# Patient Record
Sex: Male | Born: 1990 | Race: Black or African American | Hispanic: No | Smoking: Light tobacco smoker
Health system: Southern US, Community
[De-identification: ages and names within clinical notes are randomized; demographics above are authoritative.]

## PROBLEM LIST (undated history)

## (undated) DIAGNOSIS — R569 Unspecified convulsions: Secondary | ICD-10-CM

## (undated) DIAGNOSIS — J45909 Unspecified asthma, uncomplicated: Secondary | ICD-10-CM

## (undated) HISTORY — PX: STOMACH SURGERY: SHX791

---

## 2019-12-30 DIAGNOSIS — E875 Hyperkalemia: Secondary | ICD-10-CM | POA: Diagnosis present

## 2019-12-30 DIAGNOSIS — J9600 Acute respiratory failure, unspecified whether with hypoxia or hypercapnia: Secondary | ICD-10-CM | POA: Diagnosis present

## 2019-12-30 DIAGNOSIS — R200 Anesthesia of skin: Secondary | ICD-10-CM | POA: Diagnosis present

## 2019-12-30 DIAGNOSIS — R402312 Coma scale, best motor response, none, at arrival to emergency department: Secondary | ICD-10-CM | POA: Diagnosis present

## 2019-12-30 DIAGNOSIS — T17918A Gastric contents in respiratory tract, part unspecified causing other injury, initial encounter: Secondary | ICD-10-CM | POA: Diagnosis present

## 2019-12-30 DIAGNOSIS — N390 Urinary tract infection, site not specified: Secondary | ICD-10-CM | POA: Diagnosis present

## 2019-12-30 DIAGNOSIS — Z765 Malingerer [conscious simulation]: Secondary | ICD-10-CM

## 2019-12-30 DIAGNOSIS — T17908A Unspecified foreign body in respiratory tract, part unspecified causing other injury, initial encounter: Secondary | ICD-10-CM | POA: Diagnosis present

## 2019-12-30 DIAGNOSIS — Y900 Blood alcohol level of less than 20 mg/100 ml: Secondary | ICD-10-CM | POA: Diagnosis present

## 2019-12-30 DIAGNOSIS — R402212 Coma scale, best verbal response, none, at arrival to emergency department: Secondary | ICD-10-CM | POA: Diagnosis present

## 2019-12-30 DIAGNOSIS — Z20822 Contact with and (suspected) exposure to covid-19: Secondary | ICD-10-CM | POA: Diagnosis present

## 2019-12-30 DIAGNOSIS — T407X1A Poisoning by cannabis (derivatives), accidental (unintentional), initial encounter: Principal | ICD-10-CM | POA: Diagnosis present

## 2019-12-30 DIAGNOSIS — A749 Chlamydial infection, unspecified: Secondary | ICD-10-CM | POA: Diagnosis present

## 2019-12-30 DIAGNOSIS — R402112 Coma scale, eyes open, never, at arrival to emergency department: Secondary | ICD-10-CM | POA: Diagnosis present

## 2019-12-30 DIAGNOSIS — R401 Stupor: Secondary | ICD-10-CM

## 2019-12-30 DIAGNOSIS — G9341 Metabolic encephalopathy: Secondary | ICD-10-CM | POA: Diagnosis present

## 2019-12-30 DIAGNOSIS — A549 Gonococcal infection, unspecified: Secondary | ICD-10-CM | POA: Diagnosis present

## 2019-12-30 NOTE — ED Provider Notes (Signed)
[facility] Emergency Department Provider Note  ____________________________________________   First MD Initiated Contact with Patient 12/30/19 2343     (approximate)  I have reviewed the triage vital signs and the nursing notes.   HISTORY  Chief Complaint Drug Overdose and Altered Mental Status  Level 5 caveat:  history/ROS limited by acute/critical illness  HPI Calvin Woodward is a 29 y.o. male with unknown past medical and social history who presents by EMS for unresponsiveness.  History was limited.  Apparently the patient was found in a ditch near his home.  There was some question of an unknown substance but no one could tell me anything about the history except that his wife spoke briefly to paramedics.  He was minimally responsive to anything including painful stimuli for paramedics and received a total of 8 mg of Narcan in route to the hospital via IV, intramuscular, and intranasal methods, with no response.  He intermittently would have episodes of apnea and then would begin breathing but with a wheezing and labored quality.  Upon arrival to the ED he was unresponsive to verbal and painful stimuli.  See hospital course for additional details.         History reviewed. No pertinent past medical history.  Patient Active Problem List   Diagnosis Date Noted  . Acute metabolic encephalopathy 12/31/2019    History reviewed. No pertinent surgical history.  Prior to Admission medications   Not on File    Allergies Patient has no known allergies.  History reviewed. No pertinent family history.  Social History Social History   Tobacco Use  . Smoking status: Unknown If Ever Smoked  Substance Use Topics  . Alcohol use: Not on file  . Drug use: Not on file    Review of Systems Level 5 caveat:  history/ROS limited by acute/critical illness ____________________________________________   PHYSICAL EXAM:  ED Triage Vitals  Enc Vitals  Group     BP 12/30/19 2325 124/81     Pulse Rate 12/30/19 2330 95     Resp 12/30/19 2335 (!) 22     Temp 12/31/19 0147 (!) 97.2 F (36.2 C)     Temp Source 12/31/19 0147 Axillary     SpO2 12/30/19 2330 100 %     Weight 12/30/19 2325 73.5 kg (162 lb 0.6 oz)     Height --      Head Circumference --      Peak Flow --      Pain Score 12/30/19 2325 0     Pain Loc --      Pain Edu? --      Excl. in GC? --      Constitutional: The patient is breathing on his own but in a labored fashion.  He is unresponsive to stimuli.  Home monitoring/law enforcement ankle bracelet is present on his right ankle. Eyes: Conjunctivae are normal.  His eyes occasionally flutter but do not open spontaneously.  No gaze deviation.  No corneal reflex.  Pupils are sluggish but equal and small but not miotic. Head: Atraumatic. Nose: No congestion/rhinnorhea. Mouth/Throat: Airway is generally unremarkable upon intubation with a moderate amount of saliva and clear secretions but no blood and no evidence of infection in the airway. Neck: No stridor.  No meningeal signs.  No expanding neck hematomas. Cardiovascular: Mild tachycardia, regular rhythm. Good peripheral circulation. Grossly normal heart sounds. Respiratory: Coarse breath sounds and wheezing, slow but spontaneous respirations.  Occasional episodes of apnea that goes on  for a few seconds before he once again resumes spontaneous breathing. Gastrointestinal: Soft and nontender. No distention.  Musculoskeletal: Muscular and thin/healthy body habitus.  No gross deformities appreciated. Neurologic: GCS of 3 with no response to suction with a Yankauer, no corneal reflex, no response to painful stimuli.  He grimaced upon inhaling an ammonia capsule and started to gag briefly but had no other response. Skin:  Skin is warm, dry and intact.  Extensive tattoos, no evidence of acute infection or injury on his skin.   ____________________________________________    LABS (all labs ordered are listed, but only abnormal results are displayed)  Labs Reviewed  CHLAMYDIA/NGC RT PCR ([facility] ONLY) - Abnormal; Notable for the following components:      Result Value   Chlamydia Tr DETECTED (*)    N gonorrhoeae DETECTED (*)    All other components within normal limits  LACTIC ACID, PLASMA - Abnormal; Notable for the following components:   Lactic Acid, Venous 2.5 (*)    All other components within normal limits  CBC WITH DIFFERENTIAL/PLATELET - Abnormal; Notable for the following components:   WBC 10.7 (*)    All other components within normal limits  COMPREHENSIVE METABOLIC PANEL - Abnormal; Notable for the following components:   Potassium 5.3 (*)    Glucose, Bld 100 (*)    All other components within normal limits  BLOOD GAS, ARTERIAL - Abnormal; Notable for the following components:   pH, Arterial 7.50 (*)    pO2, Arterial 181 (*)    Acid-Base Excess 2.4 (*)    All other components within normal limits  URINALYSIS, ROUTINE W REFLEX MICROSCOPIC - Abnormal; Notable for the following components:   Color, Urine YELLOW (*)    APPearance HAZY (*)    Specific Gravity, Urine 1.033 (*)    Protein, ur 30 (*)    Leukocytes,Ua MODERATE (*)    WBC, UA >50 (*)    All other components within normal limits  URINE DRUG SCREEN, QUALITATIVE ([facility] ONLY) - Abnormal; Notable for the following components:   Cannabinoid 50 Ng, Ur [place] POSITIVE (*)    All other components within normal limits  RESPIRATORY PANEL BY RT PCR (FLU A&B, COVID)  CULTURE, RESPIRATORY  CULTURE, BLOOD (ROUTINE X 2)  CULTURE, BLOOD (ROUTINE X 2)  MRSA PCR SCREENING  ETHANOL  MAGNESIUM  LIPASE, BLOOD  RAPID HIV SCREEN (HIV 1/2 AB+AG)  LACTIC ACID, PLASMA  HEPATITIS C ANTIBODY  HEPATITIS B SURFACE ANTIBODY, QUANTITATIVE  PROCALCITONIN  CBC  COMPREHENSIVE METABOLIC PANEL  AMMONIA  STREP PNEUMONIAE URINARY ANTIGEN  LEGIONELLA PNEUMOPHILA SEROGP 1 UR AG  SALICYLATE LEVEL   ACETAMINOPHEN LEVEL  BLOOD GAS, ARTERIAL   ____________________________________________  EKG  ED ECG REPORT I, Hinda Kehr, the attending physician, personally viewed and interpreted this ECG.  Date: 12/30/2019 EKG Time: 23:35 Rate: 81 Rhythm: normal sinus rhythm QRS Axis: normal Intervals: normal ST/T Wave abnormalities: early repolarization Narrative Interpretation: no definitive evidence of acute ischemia; does not meet STEMI criteria.   ____________________________________________  RADIOLOGY I, Hinda Kehr, personally viewed and evaluated these images (plain radiographs) as part of my medical decision making, as well as reviewing the written report by the radiologist.  ED MD interpretation:  No acute abnormalities on CT head nor C-spine.  Appropriate tube placement on CXR.  Official radiology report(s): DG Abdomen 1 View  Result Date: 12/31/2019 CLINICAL DATA:  OG tube placement EXAM: ABDOMEN - 1 VIEW COMPARISON:  None. FINDINGS: The OG tube extends below the left  hemidiaphragm. The tip terminates over the expected region of the gastric body. The visualized upper abdomen is unremarkable otherwise. IMPRESSION: OG tube projects over the gastric body. Electronically Signed   By: Katherine Mantle M.D.   On: 12/31/2019 00:26   CT Head Wo Contrast  Result Date: 12/31/2019 CLINICAL DATA:  Altered mental status. EXAM: CT HEAD WITHOUT CONTRAST CT CERVICAL SPINE WITHOUT CONTRAST TECHNIQUE: Multidetector CT imaging of the head and cervical spine was performed following the standard protocol without intravenous contrast. Multiplanar CT image reconstructions of the cervical spine were also generated. COMPARISON:  None. FINDINGS: CT HEAD FINDINGS Brain: No evidence of acute infarction, hemorrhage, hydrocephalus, extra-axial collection or mass lesion/mass effect. Vascular: No hyperdense vessel or unexpected calcification. Skull: Normal. Negative for fracture or focal lesion.  Sinuses/Orbits: No acute finding. Other: None. CT CERVICAL SPINE FINDINGS Alignment: Normal. Skull base and vertebrae: No acute fracture. No primary bone lesion or focal pathologic process. Soft tissues and spinal canal: No prevertebral fluid or swelling. No visible canal hematoma. Disc levels: The disc heights are relatively well preserved throughout the cervical spine. Upper chest: Negative. Other: None IMPRESSION: 1. Normal CT evaluation of the head. 2. No evidence of acute traumatic injury to the cervical spine. Electronically Signed   By: Katherine Mantle M.D.   On: 12/31/2019 01:49   DG Chest Portable 1 View  Result Date: 12/31/2019 CLINICAL DATA:  OG tube placement. Endotracheal tube placement. Overdose. EXAM: PORTABLE CHEST 1 VIEW COMPARISON:  None. FINDINGS: The endotracheal tube terminates approximately 5.8 cm above the carina. The enteric tube appears to extend below the left hemidiaphragm. The tip is not visualized on this study. The lungs are clear. There is no pneumothorax or focal infiltrate. No significant pleural effusion. The heart size is normal. IMPRESSION: 1. Satisfactory position of the endotracheal tube. 2. Enteric tube appears to extend below the left hemidiaphragm. 3. No acute cardiopulmonary process. Electronically Signed   By: Katherine Mantle M.D.   On: 12/31/2019 00:25    ____________________________________________   PROCEDURES   Procedure(s) performed (including Critical Care):  .Critical Care Performed by: Loleta Rose, MD Authorized by: Loleta Rose, MD   Critical care provider statement:    Critical care time (minutes):  45   Critical care time was exclusive of:  Separately billable procedures and treating other patients   Critical care was necessary to treat or prevent imminent or life-threatening deterioration of the following conditions:  Toxidrome and respiratory failure   Critical care was time spent personally by me on the following activities:   Development of treatment plan with patient or surrogate, discussions with consultants, evaluation of patient's response to treatment, examination of patient, obtaining history from patient or surrogate, ordering and performing treatments and interventions, ordering and review of laboratory studies, ordering and review of radiographic studies, pulse oximetry, re-evaluation of patient's condition and review of old charts .1-3 Lead EKG Interpretation Performed by: Loleta Rose, MD Authorized by: Loleta Rose, MD     Interpretation: abnormal     ECG rate:  102   ECG rate assessment: tachycardic     Rhythm: sinus rhythm     Ectopy: none     Conduction: normal   Procedure Name: Intubation Date/Time: 12/30/2019 11:30 PM Performed by: Loleta Rose, MD Pre-anesthesia Checklist: Patient identified, Emergency Drugs available, Suction available and Patient being monitored Oxygen Delivery Method: Nasal cannula Preoxygenation: Pre-oxygenation with 100% oxygen Induction Type: IV induction and Rapid sequence Laryngoscope Size: Glidescope and 3 Tube size: 7.5 mm Number  of attempts: 1 Placement Confirmation: ETT inserted through vocal cords under direct vision,  CO2 detector and Breath sounds checked- equal and bilateral Secured at: 24 cm Tube secured with: ETT holder Dental Injury: Teeth and Oropharynx as per pre-operative assessment         ____________________________________________   INITIAL IMPRESSION / MDM / ASSESSMENT AND PLAN / ED COURSE  As part of my medical decision making, I reviewed the following data within the electronic MEDICAL RECORD NUMBER Nursing notes reviewed and incorporated, Labs reviewed , EKG interpreted , Old chart reviewed, Radiograph reviewed , Discussed with admitting provider (ICU NP Elvina Sidle) and reviewed Notes from prior ED visits   Differential diagnosis includes, but is not limited to, substance abuse/overdose, acute intracranial injury (hemorrhage or less likely  ischemic CVA), cervical spine injury, aspiration.  The patient had no response to Narcan both prior to arrival and to Narcan 1 mg IV upon arrival to the emergency department.  I observed him for a couple of minutes but he has virtually no response to painful stimuli and he is not protecting his airway with frequent episodes of apnea and intolerance of his secretions, occasional vomiting clear fluids or saliva, etc.  For his safety I performed RSI to secure his airway.  Etomidate 20 mg IV and rocuronium 100 mg IV were used for induction and paralytic.  I asked respiratory therapy to start his PEEP at 10 given the possibility of aspiration.  Once he is settled and on a propofol infusion starting at 20 mcg/kg/min supplemented with fentanyl 100 mcg/h, he will get a head CT and cervical spine CT since I cannot rule out acute intracranial hemorrhage or trauma.  Anticipate admission to the ICU. Labs pending including ABG.      Clinical Course as of Dec 31 222  Fri Dec 31, 2019  0014 RT is adjusting rate down to 14 and PEEP down to 5.  Blood gas, arterial(!) [CF]  0015 WBC's in urine.  Will send GC/chlamydia given that this would be a more likely diagnosis than a complicated UTI in a young male patient  Urinalysis, Routine w reflex microscopic(!) [CF]  0016 Alcohol, Ethyl (B): <10 [CF]  0035 Cannabinoid 50 Ng, Ur [place](!): POSITIVE [CF]  0035 Ordering IV fluids (NS 1L IV bolus), but no other treatment indicated at this time.  Potassium(!): 5.3 [CF]  0037 Appropriate tube placement on CXR and abd films, no acute abnormalities identified   [CF]  0122 I spoke by phone with Jeri Modena (ICU NP) who will admit the patient.  We discussed the case in detail including the pyuria and pending tests.   [CF]  0125 Patient starting to block the vent a little bit with some right arm movement.  I authorized 2 separate IV boluses of propofol 50 mg and the infusion has been increased to 30 mcg/kg/min.  He is also now getting  fentanyl 100 mcg/h.   [CF]  0202 Delayed documentation: I woman who identified herself as Rosiland Oz and the patient's wife (the same person who called and spoke with paramedics) came to the ED and I updated her about the patient's status.  She was brought back to the patient's room.  After that point somehow it was identified that she was not in fact legally married to the patient and she was asked to leave.  ED charge nurse Erie Noe has been in contact with one of the patient's sisters who is out of town but she is apparently the closest legal family  member.  The patient is stable for transfer to the ICU.   [CF]  0203 No acute abnormalities identified on head CT   [CF]    Clinical Course User Index [CF] Loleta Rose, MD     ____________________________________________  FINAL CLINICAL IMPRESSION(S) / ED DIAGNOSES  Final diagnoses:  Acute respiratory failure, unspecified whether with hypoxia or hypercapnia (HCC)  Obtundation     MEDICATIONS GIVEN DURING THIS VISIT:  Medications  fentaNYL in NS (23mcg/ml) infusion-PREMIX (50 mcg/hr Intravenous Bolus 12/31/19 0216)  docusate sodium (COLACE) capsule 100 mg (has no administration in time range)  polyethylene glycol (MIRALAX / GLYCOLAX) packet 17 g (has no administration in time range)  enoxaparin (LOVENOX) injection 40 mg (has no administration in time range)  famotidine (PEPCID) IVPB 20 mg premix (has no administration in time range)  0.9 %  sodium chloride infusion (has no administration in time range)  acetaminophen (TYLENOL) tablet 650 mg (has no administration in time range)  ondansetron (ZOFRAN) injection 4 mg (has no administration in time range)  ipratropium-albuterol (DUONEB) 0.5-2.5 (3) MG/3ML nebulizer solution 3 mL (has no administration in time range)  Chlorhexidine Gluconate Cloth 2 % PADS 6 each (has no administration in time range)  docusate (COLACE) 50 MG/5ML liquid 100 mg (has no administration in time  range)  polyethylene glycol (MIRALAX / GLYCOLAX) packet 17 g (has no administration in time range)  fentaNYL (SUBLIMAZE) bolus via infusion 50 mcg (has no administration in time range)  midazolam (VERSED) injection 2 mg (has no administration in time range)  midazolam (VERSED) injection 2 mg (has no administration in time range)  etomidate (AMIDATE) injection (20 mg Intravenous Given 12/30/19 2336)  rocuronium (ZEMURON) injection (100 mg Intravenous Given 12/30/19 2336)  propofol (DIPRIVAN) 1000 MG/100ML infusion (10 mg Intravenous New Bag/Given 12/31/19 0210)  naloxone (NARCAN) injection 1 mg (1 mg Intravenous Given 12/30/19 2332)  sodium chloride 0.9 % bolus 1,000 mL (0 mLs Intravenous Stopped 12/31/19 0201)  midazolam (VERSED) injection 4 mg (4 mg Intravenous Given 12/31/19 0216)     ED Discharge Orders    None      *Please note:  Calvin Woodward was evaluated in Emergency Department on 12/31/2019 for the symptoms described in the history of present illness. He was evaluated in the context of the global COVID-19 pandemic, which necessitated consideration that the patient might be at risk for infection with the SARS-CoV-2 virus that causes COVID-19. Institutional protocols and algorithms that pertain to the evaluation of patients at risk for COVID-19 are in a state of rapid change based on information released by regulatory bodies including the CDC and federal and state organizations. These policies and algorithms were followed during the patient's care in the ED.  Some ED evaluations and interventions may be delayed as a result of limited staffing during the pandemic.*  Note:  This document was prepared using Dragon voice recognition software and may include unintentional dictation errors.   Loleta Rose, MD 12/31/19 [phone]

## 2019-12-31 DIAGNOSIS — N390 Urinary tract infection, site not specified: Secondary | ICD-10-CM

## 2019-12-31 DIAGNOSIS — A549 Gonococcal infection, unspecified: Secondary | ICD-10-CM | POA: Diagnosis present

## 2019-12-31 DIAGNOSIS — J9601 Acute respiratory failure with hypoxia: Secondary | ICD-10-CM

## 2019-12-31 DIAGNOSIS — A749 Chlamydial infection, unspecified: Secondary | ICD-10-CM | POA: Diagnosis present

## 2019-12-31 DIAGNOSIS — T17908A Unspecified foreign body in respiratory tract, part unspecified causing other injury, initial encounter: Secondary | ICD-10-CM | POA: Diagnosis present

## 2019-12-31 DIAGNOSIS — R200 Anesthesia of skin: Secondary | ICD-10-CM

## 2019-12-31 DIAGNOSIS — G9341 Metabolic encephalopathy: Secondary | ICD-10-CM

## 2019-12-31 NOTE — Progress Notes (Signed)
  PROGRESS NOTE    Calvin Woodward   ZOX:[id number]  DOB: 01-03-1991  PCP: Patient, No Pcp Per    DOA: 12/30/2019 LOS: 0   Brief Narrative   29 y.o. male without known medical history admitted to ICU overnight after being found unresponsive and required intubation for airway protection. This morning, patient was successfully extubated and is stable for TRH to assume care tomorrow.   On admission, patient's UDS was positive for marijuana, alcohol level was less than 10.  Suspected etiology is drug overdose, however further work-up needed to rule out stroke or other cause of patient's obtundation. Patient also now reporting bilateral foot numbness.  Patient has ankle bracelet in place and is on house arrest. MRI lumbar spine has been recommended because patient reports he is unable to feel his feet, however patient is reluctant to remove the ankle bracelet. Per Dr. Belia Woodward, [facility] PD is on board and will be letting patient know if it is okay to remove this for MRI.  Assessment & Plan   Active Problems:   Acute metabolic encephalopathy   Numbness of foot   Gonorrhea in male   Chlamydia   Acute lower UTI   Aspiration into airway  I have reviewed the H&P and progress notes by PCCM and agree with the assessment and plan as outlined.  In addition: --order lumbar MRI if/when patient given okay to remove ankle bracelet --consider neurology consult  --continue antibiotics for G&C UTI --monitor respiratory status closely as patient seen aspirating in ED      Calvin Banter, DO [facility]   If 7PM-7AM, please contact night-coverage [email or web address] 12/31/2019, 2:51 PM

## 2019-12-31 NOTE — Progress Notes (Signed)
eLink Physician-Brief Progress Note Patient Name: Calvin Woodward DOB: 1991/06/30 MRN: [id number]   Date of Service  12/31/2019  HPI/Events of Note  Pt admitted with altered mental status and acute respiratory failure of unclear etiology at this point, work up is ongoing, one possibility under consideration is substance intoxication, but CVA, ICH and other possibilities will need to be r/o by imaging and other testing.  eICU Interventions  New Patient Evaluation completed.        Thomasene Lot Ogan 12/31/2019, 2:37 AM

## 2019-12-31 NOTE — Progress Notes (Signed)
Successfully extubated VS stable  Unable to feel legs MRI pending if/when patient given okay to remove ankle bracelet Transfer to [facility] service 4/10

## 2019-12-31 NOTE — Progress Notes (Addendum)
After consulting with Dr. Belia Heman, Fentanyl and Propofol drips discontinued. Patient started on Precedex for planned extubation. Sister from [place]. Myrene Buddy, in to see patient. 1100 Patient is slowly waking up. Precedex weaned as well. 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

## 2019-12-31 NOTE — ED Triage Notes (Signed)
Pt to ED via EMS emergency traffic c/o found in a ditch in the yard, fire first on scene, unknown if any substance use, given 8mg  narcan total.  Pt presents unresponsive to painful stimuli, agonal respirations.  Dr. at bedside upon arrival, decision to intubate patient quickly made.

## 2019-12-31 NOTE — H&P (Signed)
Name: Calvin Woodward MRN: [id number] DOB: 10/12/1990    ADMISSION DATE:  12/30/2019 CONSULTATION DATE:  12/31/2019  REFERRING MD :  Dr. Karma Greaser  CHIEF COMPLAINT:  Altered Mental Status  BRIEF PATIENT DESCRIPTION:  29 year old male with no known medical history admitted 12/31/19 due to acute metabolic encephalopathy in the setting of suspected drug overdose requiring intubation for airway protection.  Urine drug screen is positive for cannabinoid.  Found to have UTI, chlamydia and gonorrhea urine PCR's are both positive.  Concern for possible aspiration.  SIGNIFICANT EVENTS  4/8: Presented to ED, minimally responsive, required intubation for airway protection 4/8: UDS positive for cannabinoid 4/8: chlamydia and gonorrhea urine PCR's are both positive. 4/9: Admission to ICU  STUDIES:  4/8: CXR>> The endotracheal tube terminates approximately 5.8 cm above the carina. The enteric tube appears to extend below the left hemidiaphragm. The tip is not visualized on this study. The lungs are clear. There is no pneumothorax or focal infiltrate. No significant pleural effusion. The heart size is normal. 4/9: CT Head w/o Contrast>> Normal CT evaluation of the head. 4/9: CT Cervical Spine>> No evidence of acute traumatic injury to the cervical spine   CULTURES: SARS-CoV-2 PCR 4/9>> Negative Influenza PCR 4/9>> Negative Urine 4/9>> Chlamydia urine PCR 4/9>> POSITIVE Gonorrhea urine PCR 4/9>> POSITIVE  HIV 4/9>> unreactive Blood culture x2 4/9>> Tracheal aspirate 4/9>> Strep pneumo urinary antigen 4/9>> Legionella urinary antigen 4/9>>  ANTIBIOTICS: Ceftriaxone 500 mg IM x1 dose 4/9 Doxycycline 4/9>>  HISTORY OF PRESENT ILLNESS:   Calvin Woodward is a 29 year old male with no known past medical history who presents to [facility] ED on 12/30/2019 due to unresponsiveness.  Patient is currently intubated and sedated with no family present, therefore history is obtained from ED and nursing  notes.  Per notes the patient was found in a ditch near his home, only minimally responsive to painful stimuli. There is no other information available at this time regarding background events leading up to him being found unresponsive.     He received a total of 8 mg of Narcan by EMS in route to the hospital, with no response noted.  Upon presentation to the ED he remained minimally responsive with GCS of 3 despite another 1 mg Narcan.  He was noted to have intermittent episodes of apnea and occasional vomiting of clear fluid/saliva.  He was emergently intubated by the ED provider for airway protection.  Initial work-up in the ED revealed WBC 10.7, lactic acid 2.5, potassium 5.3. Serum ethyl alcohol is less than 10.  Serum acetaminophen and salicylates are currently pending. Urine Drug screen is positive for cannabinoid. Urinalysis with positive leukocytes and greater than 50 WBCs concerning for UTI.  His chlamydia and gonorrhea urine PCR's are both positive.  His SARS-CoV-2 and influenza PCR are both negative.  CT head and CT cervical spine are both negative for any acute processes.  Chest x-ray without any acute cardiopulmonary process.      PCCM is asked to admit the patient for further work-up and treatment of severe acute metabolic encephalopathy in the setting of suspected drug overdose requiring intubation for airway protection.  PAST MEDICAL HISTORY :   has no past medical history on file.  has no past surgical history on file. Prior to Admission medications   Not on File   No Known Allergies  FAMILY HISTORY:  family history is not on file. SOCIAL HISTORY:     COVID-19 DISASTER DECLARATION:  FULL CONTACT PHYSICAL EXAMINATION WAS  NOT POSSIBLE DUE TO TREATMENT OF COVID-19 AND  CONSERVATION OF PERSONAL PROTECTIVE EQUIPMENT, LIMITED EXAM FINDINGS INCLUDE-  Patient assessed or the symptoms described in the history of present illness.  In the context of the Global COVID-19 pandemic,  which necessitated consideration that the patient might be at risk for infection with the SARS-CoV-2 virus that causes COVID-19, Institutional protocols and algorithms that pertain to the evaluation of patients at risk for COVID-19 are in a state of rapid change based on information released by regulatory bodies including the CDC and federal and state organizations. These policies and algorithms were followed during the patient's care while in hospital.  REVIEW OF SYSTEMS:   Unable to assess due to intubation and sedation  SUBJECTIVE:  Unable to assess due to intubation and sedation  VITAL SIGNS: SpO2:  [100 %] 100 % (04/09 0001) FiO2 (%):  [40 %-100 %] 40 % (04/09 0016)  PHYSICAL EXAMINATION: General: Acutely ill-appearing male, laying in bed, intubated sedated, in no acute distress Neuro: Sedated, withdraws from pain, pupils PERRLA HEENT: Atraumatic, normocephalic, neck supple, no JVD Cardiovascular: Bradycardia, regular rhythm, S1-S2, no murmurs, rubs, gallops, 2+ pulses throughout Lungs: Clear to auscultation bilaterally, even, vent assisted Abdomen: Soft, nontender, nondistended, no guarding rebound tenderness, bowel sounds positive x4 Musculoskeletal: Normal bulk and tone, no deformities, no edema Skin: Warm and dry; no obvious rashes, lesions, ulcerations  Recent Labs  Lab 12/30/19 2348  NA 138  K 5.3*  CL 104  CO2 23  BUN 18  CREATININE 1.20  GLUCOSE 100*   Recent Labs  Lab 12/30/19 2348  HGB 15.0  HCT 46.7  WBC 10.7*  PLT 257   DG Abdomen 1 View  Result Date: 12/31/2019 CLINICAL DATA:  OG tube placement EXAM: ABDOMEN - 1 VIEW COMPARISON:  None. FINDINGS: The OG tube extends below the left hemidiaphragm. The tip terminates over the expected region of the gastric body. The visualized upper abdomen is unremarkable otherwise. IMPRESSION: OG tube projects over the gastric body. Electronically Signed   By: Constance Holster M.D.   On: 12/31/2019 00:26   DG Chest  Portable 1 View  Result Date: 12/31/2019 CLINICAL DATA:  OG tube placement. Endotracheal tube placement. Overdose. EXAM: PORTABLE CHEST 1 VIEW COMPARISON:  None. FINDINGS: The endotracheal tube terminates approximately 5.8 cm above the carina. The enteric tube appears to extend below the left hemidiaphragm. The tip is not visualized on this study. The lungs are clear. There is no pneumothorax or focal infiltrate. No significant pleural effusion. The heart size is normal. IMPRESSION: 1. Satisfactory position of the endotracheal tube. 2. Enteric tube appears to extend below the left hemidiaphragm. 3. No acute cardiopulmonary process. Electronically Signed   By: Constance Holster M.D.   On: 12/31/2019 00:25    ASSESSMENT / PLAN:  Intubated for airway protection due to severe metabolic encephalopathy in setting of suspected drug overdose  ?  Aspiration pneumonia  -Full vent support -Wean FiO2 and PEEP as tolerated to maintain O2 sats greater than 92% -Follow intermittent chest x-ray and ABG as needed -VAP protocol -As needed bronchodilators -Spontaneous breathing trials when respiratory parameters met and mental status permits -Initial x-ray without evidence of aspiration, however was noted to be aspirating when initially presented to ED prior to intubation ~ follow CXR to assess for developing infiltrate  Chlamydia & Gonorrhea UTI ?  Aspiration pneumonia -Monitor fever curve -Trend WBCs and procalcitonin -Follow cultures as above -Received 500 mg IM Ceftriaxone x1 dose; Doxycycline x7 days -Initial  chest x-ray without evidence of aspiration, however was noted to be aspirating prior to intubation in the ED ~continue to follow serial chest x-rays for developing infiltrate and need for aspiration coverage  Acute metabolic encephalopathy in the setting of suspected drug overdose -Provide supportive care -Fentanyl and propofol drips to maintain RASS goal 0 to -1 -Daily wake-up assessment -CT  head 4/9 negative for any acute intracranial process -Urine drug screen is positive for cannabinoids -Serum ethyl alcohol < 10 -Check serum salicylates and acetaminophen levels -Check ammonia level -Continuous cardiac monitoring -Serial EKGs every 6 hours  Mild Hyperkalemia -Monitor I&O's / urinary output -Follow BMP -Ensure adequate renal perfusion -Avoid nephrotoxic agents as able -Replace electrolytes as indicated -IV Fluids -Recheck K on morning labs         BEST PRACTICES: Disposition: ICU Goals of care: Full code VTE prophylaxis: Subcu Lovenox Stress ulcer prophylaxis: IV Pepcid Updates: No family at bedside for update during NP rounds 12/31/19 Consults: None   Darel Hong, AGACNP-BC [facility] Pager: [phone]  12/31/2019, 1:32 AM

## 2019-12-31 NOTE — Discharge Summary (Addendum)
Physician Discharge Summary  Patient ID: Karla Vines MRN: [id number] DOB/AGE: [birth year] 29 y.o.  Admit date: 12/30/2019 Discharge date: 12/31/2019  Admission Diagnoses:ACUTE ENCEPHALOPATHY FROM ACUTE DRUG ABUSE SEXUALLY TRANSMITTED DISEASE  Discharge Diagnoses:  Active Problems:   Acute metabolic encephalopathy   Numbness of foot   Gonorrhea in male   Chlamydia   Acute lower UTI   Aspiration into airway   Discharged Condition: unstable  Hospital Course:  29 y.o. male without known medical history admitted to ICU overnight after being found unresponsive and required intubation for airway protection. This morning, patient was successfully extubated and is stable for TRH to assume care tomorrow.   On admission, patient's UDS was positive for marijuana, alcohol level was less than 10.  Suspected etiology is drug overdose, however further work-up needed to rule out stroke or other cause of patient's obtundation. Patient also now reporting bilateral foot numbness.  ALL HIS SYMPTOMS RESOLVED PATIENT WAS MOVING AROUND WITHOUT ANY ISSUES LIKELY MALINGERING VS STABLE  PATIENT READY TO GO HOME HE HAS COMPLETED THERAPY FOR HIS STD'S   Treatments: VENT SUPPORT  PLAN TO SEND HOME TODAY    Discharge Exam: Blood pressure (!) 131/116, pulse 70, temperature 98.3 F (36.8 C), resp. rate 15, height 5\' 8"  (1.727 m), weight 67 kg, SpO2 (!) 80 %.  Physical Examination:   General Appearance: No distress  Neuro:without focal findings,  speech normal,  HEENT: PERRLA, EOM intact.   Pulmonary: normal breath sounds, No wheezing.  CardiovascularNormal S1,S2.  No m/r/g.   Abdomen: Benign, Soft, non-tender. Renal:  No costovertebral tenderness  GU:  Not performed at this time. Endoc: No evident thyromegaly Skin:   warm, no rashes, no ecchymosis  Extremities: normal, no cyanosis, clubbing. PSYCHIATRIC: Mood, affect within normal limits.   ALL OTHER ROS ARE  NEGATIVE   Disposition: HOME   FOLLOW UP CARDIOLOGY [phone]    Signed: [phone] 12/31/2019, 5:29 PM

## 2019-12-31 NOTE — Progress Notes (Signed)
1130 Extubated to room air without issues. Still very sleepy. States he cannot move his feet or feel them. Dr. Belia Heman notified.

## 2019-12-31 NOTE — Progress Notes (Signed)
Progressive day. Extubated. Awake later and up in chair. Feeling to legs returned .MRIs cancelled. Almost left AMA. Fiance' states she witnessed patient not breathing after she picked him up from corner store. He had fallen at store and helped up then fell again trying to get in car. Patient was given rescue breathing and CPR by EMS.

## 2019-12-31 NOTE — Progress Notes (Signed)
Pt was suctioned prior to extubation. Per Dr. Clovis Fredrickson order, he was extubated. He is voicing in whispers. He is on RA.

## 2019-12-31 NOTE — Progress Notes (Signed)
CH overheard pt. requesting water; RN said pt. cannot have water per recent extubation; CH obtained lemon swabs for pt.  Pt. very drowsy, expressed no further needs @ this time.    12/31/19 1245  Clinical Encounter Type  Visited With Patient  Visit Type Initial  Referral From Patient

## 2019-12-31 NOTE — Progress Notes (Signed)
CH called into pt.'s rm. by Pt.'s significant other.  Pt. and SO apparently bantering w/one another in summoning CH --> some kind of inside joke re: SO worrying about Pt. 'freaking out' when she leaves.  Pt. appears calm and to be coping effectively.  When SO left, pt. asked for curtain to be shut and for supplies to  give himself a bath.  Requests relayed to RN.

## 2020-01-01 DIAGNOSIS — A549 Gonococcal infection, unspecified: Secondary | ICD-10-CM

## 2020-01-01 DIAGNOSIS — A749 Chlamydial infection, unspecified: Secondary | ICD-10-CM

## 2020-01-01 NOTE — Progress Notes (Signed)
Discharged home with significant other.

## 2020-01-01 NOTE — Progress Notes (Signed)
Pt was d/c from the ICU before I saw the pt. Disregard note. No charge.

## 2020-01-01 NOTE — Discharge Instructions (Signed)
Follow up with [facility] in 1 week [phone].

## 2020-01-01 NOTE — Progress Notes (Signed)
Case managers Bridgett and Adelina Mings -attempted to contact concerning out patient evaluations. Unable to reach either one . Mailbox for Clarisse Gouge was full and message left for Adelina Mings to contact patient.

## 2020-01-05 NOTE — Telephone Encounter (Signed)
He DOES NOT NEED PULMONARY He has history of SYNCOPE and needs ECHO and cardiac evaluation

## 2020-01-05 NOTE — Telephone Encounter (Signed)
Pt is scheduled w/ [facility] for tomorrow 01/06/20, but they don't know what he is needing to be seen for. In the notes from his hospital stay on 12/31/2019, you put to follow up w/ Cardiology but put pulmonarys phone number. So we are just wanting to know if the patient needs to follow up w/ [facility] or Pulmonary? Thank you.

## 2020-01-06 DIAGNOSIS — R55 Syncope and collapse: Secondary | ICD-10-CM

## 2020-01-06 NOTE — Patient Instructions (Signed)
Medication Instructions:  No changes  *If you need a refill on your cardiac medications before your next appointment, please call your pharmacy*   Lab Work: None  If you have labs (blood work) drawn today and your tests are completely normal, you will receive your results only by: Marland Kitchen MyChart Message (if you have MyChart) OR . A paper copy in the mail If you have any lab test that is abnormal or we need to change your treatment, we will call you to review the results.   Testing/Procedures: Your physician has requested that you have an echocardiogram. Echocardiography is a painless test that uses sound waves to create images of your heart. It provides your doctor with information about the size and shape of your heart and how well your heart's chambers and valves are working. This procedure takes approximately one hour. There are no restrictions for this procedure.  Your physician has recommended that you wear a Zio monitor. This monitor is a medical device that records the heart's electrical activity. Doctors most often use these monitors to diagnose arrhythmias. Arrhythmias are problems with the speed or rhythm of the heartbeat. The monitor is a small device applied to your chest. You can wear one while you do your normal daily activities. While wearing this monitor if you have any symptoms to push the button and record what you felt. Once you have worn this monitor for the period of time provider prescribed (Usually 14 days), you will return the monitor device in the postage paid box. Once it is returned they will download the data collected and provide [place] with a report which the provider will then review and we will call you with those results. Important tips:  1. Avoid showering during the first 24 hours of wearing the monitor. 2. Avoid excessive sweating to help maximize wear time. 3. Do not submerge the device, no hot tubs, and no swimming pools. 4. Keep any lotions or oils away from the  patch. 5. After 24 hours you may shower with the patch on. Take brief showers with your back facing the shower head.  6. Do not remove patch once it has been placed because that will interrupt data and decrease adhesive wear time. 7. Push the button when you have any symptoms and write down what you were feeling. 8. Once you have completed wearing your monitor, remove and place into box which has postage paid and place in your outgoing mailbox.  9. If for some reason you have misplaced your box then call our office and we can provide another box and/or mail it off for you.       Follow-Up: At [facility], you and your health needs are our priority.  As part of our continuing mission to provide you with exceptional heart care, we have created designated Provider Care Teams.  These Care Teams include your primary Cardiologist (physician) and Advanced Practice Providers (APPs -  Physician Assistants and Nurse Practitioners) who all work together to provide you with the care you need, when you need it.  We recommend signing up for the patient portal called "MyChart".  Sign up information is provided on this After Visit Summary.  MyChart is used to connect with patients for Virtual Visits (Telemedicine).  Patients are able to view lab/test results, encounter notes, upcoming appointments, etc.  Non-urgent messages can be sent to your provider as well.   To learn more about what you can do with MyChart, go to [email or web address].  Your next appointment:   Follow up after testing  The format for your next appointment:   In Person  Provider:    You may see Kate Sable, MD or one of the following Advanced Practice Providers on your designated Care Team:    Murray Hodgkins, NP  Christell Faith, PA-C  Marrianne Mood, PA-C

## 2020-01-10 NOTE — Addendum Note (Signed)
Addended by: Kendrick Fries on: 01/10/2020 07:40 AM   Modules accepted: Orders

## 2020-02-08 NOTE — Telephone Encounter (Signed)
No answer. Left message to call back.   

## 2020-02-08 NOTE — Telephone Encounter (Signed)
-----   Message from Debbe Odea, MD sent at 02/08/2020  8:03 AM EDT ----- No significant arrhythmias noted.  Overall benign monitor.

## 2020-02-09 NOTE — Telephone Encounter (Signed)
No answer. Left message to call back.   

## 2020-02-11 NOTE — Telephone Encounter (Signed)
No answer. Left message to call back.  Letter with results mailed to patient.  

## 2021-03-27 IMAGING — CT CT CERVICAL SPINE W/O CM
3 of 4 series · 10 of 33 positions shown, 11 images · non-contrast
Comparison: None.

CLINICAL DATA: Altered mental status.

EXAM:
CT HEAD WITHOUT CONTRAST
CT CERVICAL SPINE WITHOUT CONTRAST
TECHNIQUE: Multidetector CT imaging of the head and cervical spine was
performed following the standard protocol without intravenous
contrast. Multiplanar CT image reconstructions of the cervical spine
were also generated.

[Series 4: sagittal bone · sagittal · 0.22mm/px · 5 of 42 slices shown]
[im 14/42  bone]
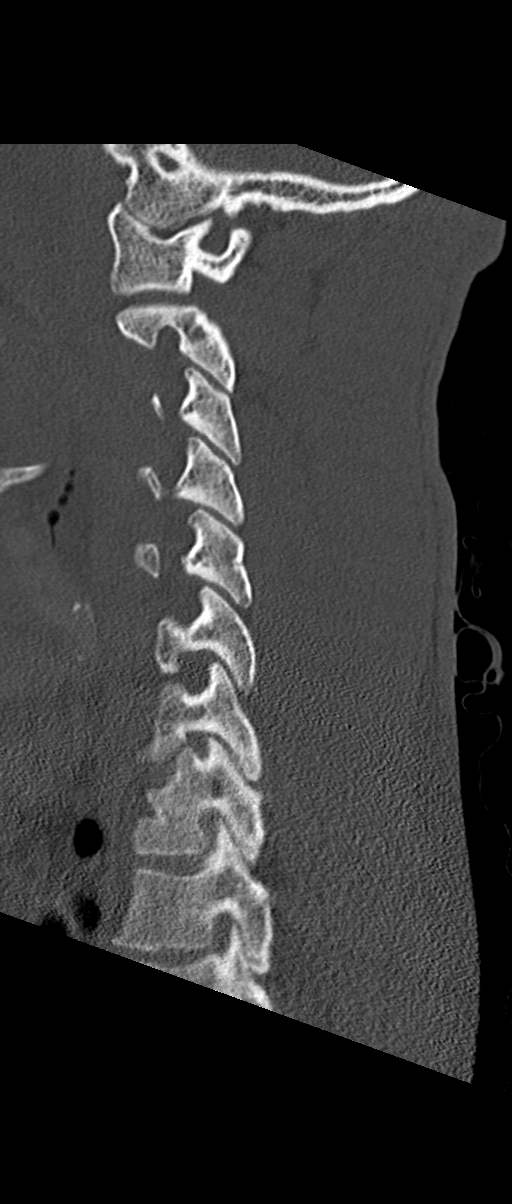
[im 18/42  bone]
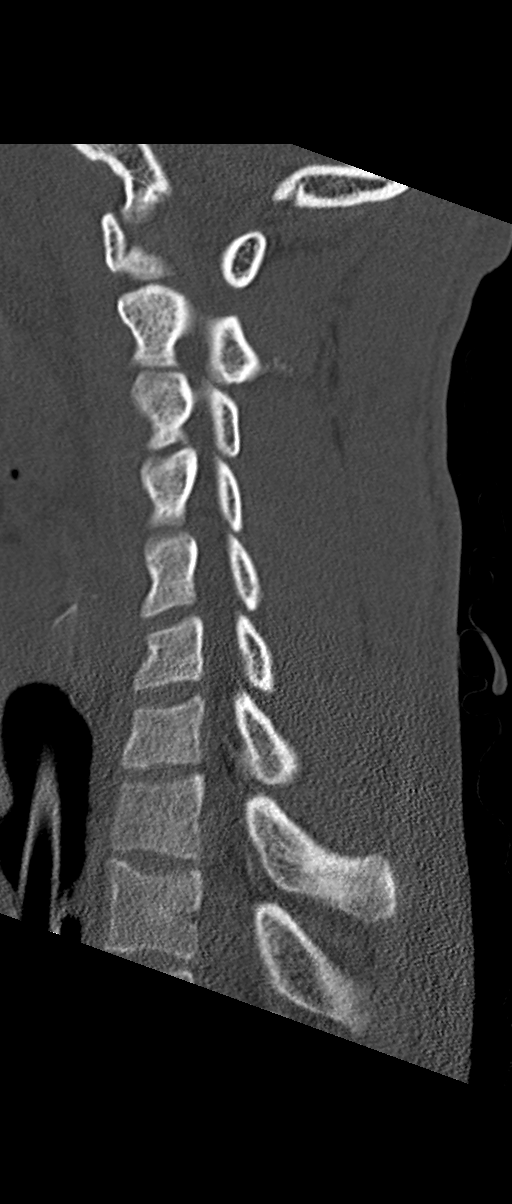
[im 21/42  bone]
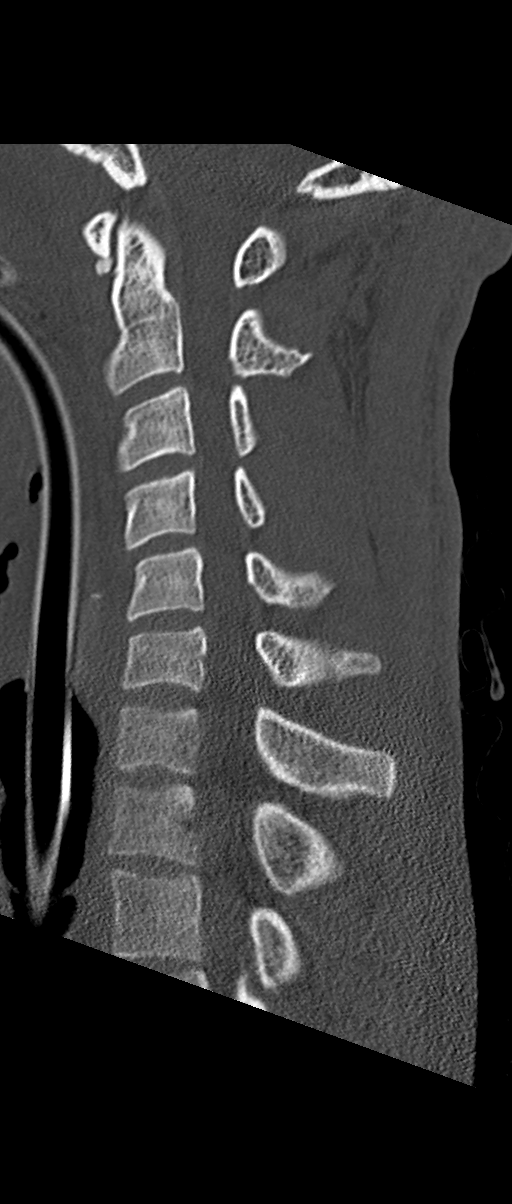
[im 24/42  bone]
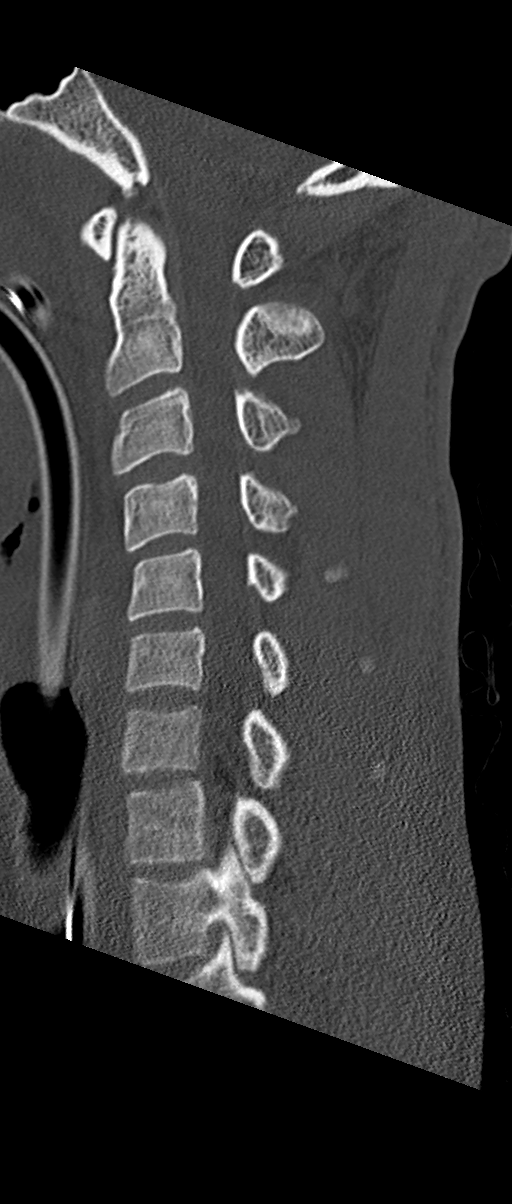
[im 28/42  bone]
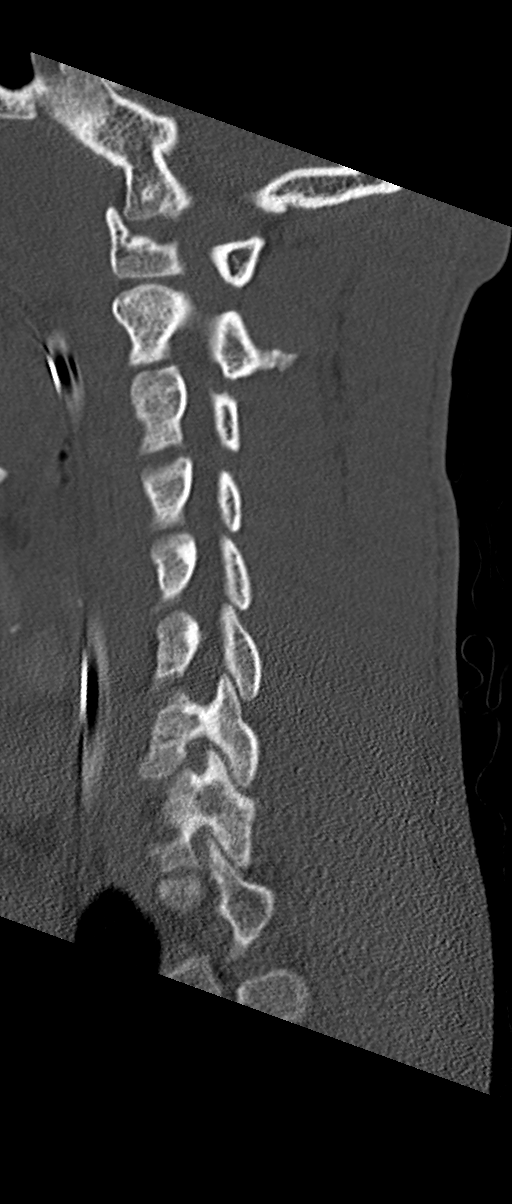

[Series 5: coronal bone · coronal · 0.26mm/px · 3 of 42 slices shown]
[im 9/42  bone]
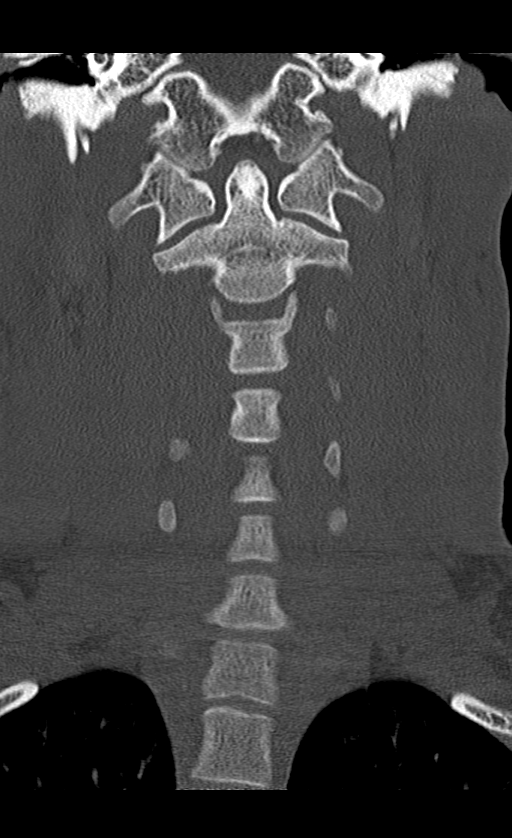
[im 17/42  bone]
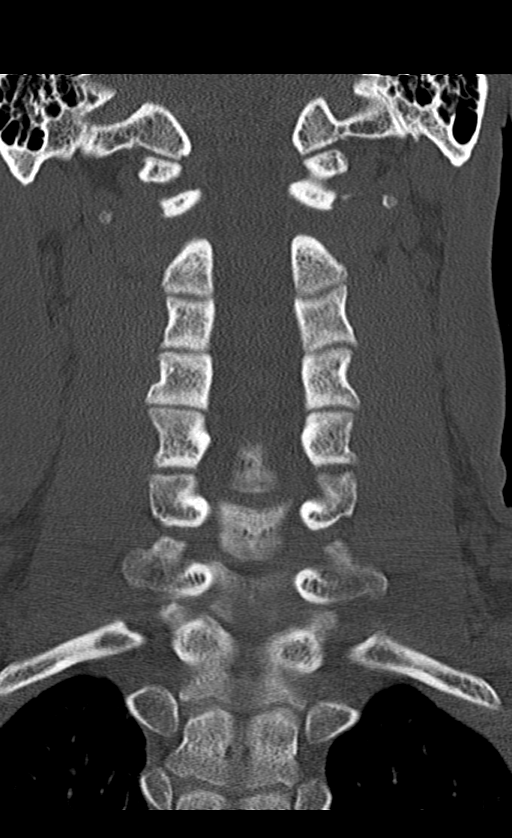
[im 25/42  bone]
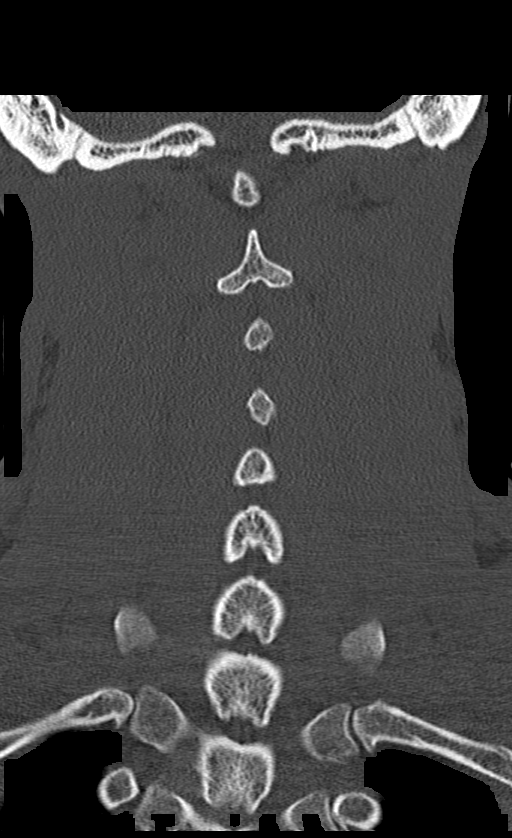

[Series 6: orthogonal bone · axial · 0.23mm/px · z∈[-221,-157]mm · 2 of 103 slices shown, 3 images]
[im 35/103  soft-tissue]
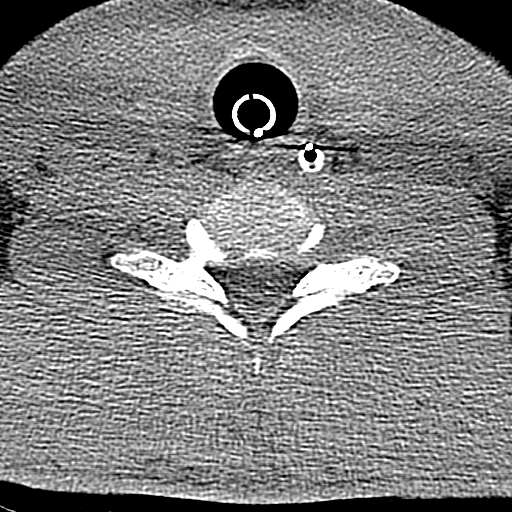
[im 35/103  bone]
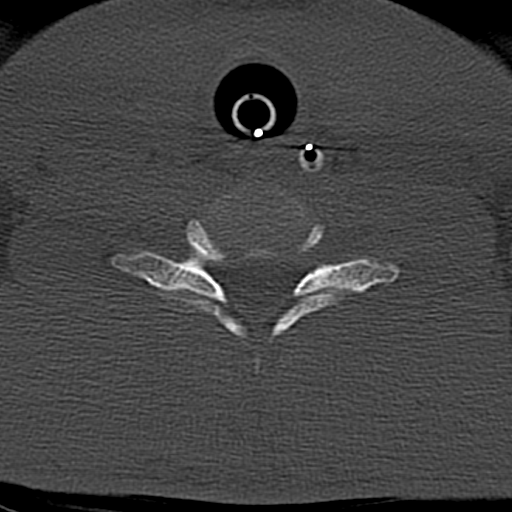
[im 69/103  bone]
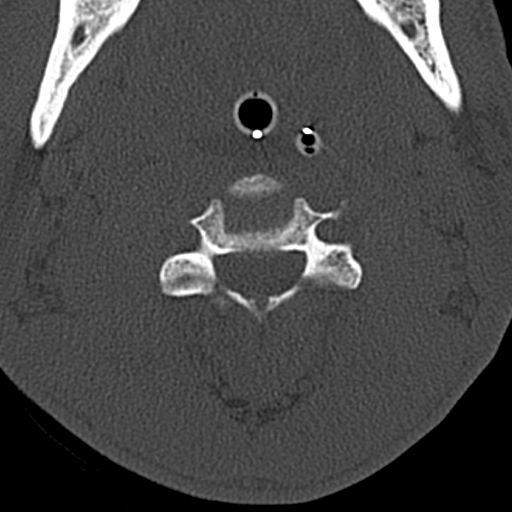

[10 of 33 positions shown; findings below may reference images not displayed]

FINDINGS: CT HEAD FINDINGS

Brain: No evidence of acute infarction, hemorrhage, hydrocephalus,
extra-axial collection or mass lesion/mass effect.

Vascular: No hyperdense vessel or unexpected calcification.

Skull: Normal. Negative for fracture or focal lesion.

Sinuses/Orbits: No acute finding.

Other: None.

CT CERVICAL SPINE FINDINGS

Alignment: Normal.

Skull base and vertebrae: No acute fracture. No primary bone lesion
or focal pathologic process.

Soft tissues and spinal canal: No prevertebral fluid or swelling. No
visible canal hematoma.

Disc levels: The disc heights are relatively well preserved
throughout the cervical spine.

Upper chest: Negative.

Other: None
IMPRESSION: 1. Normal CT evaluation of the head.
2. No evidence of acute traumatic injury to the cervical spine.

## 2022-10-29 DIAGNOSIS — T401X4A Poisoning by heroin, undetermined, initial encounter: Secondary | ICD-10-CM | POA: Insufficient documentation

## 2022-10-29 DIAGNOSIS — T50904A Poisoning by unspecified drugs, medicaments and biological substances, undetermined, initial encounter: Secondary | ICD-10-CM

## 2022-10-29 HISTORY — DX: Unspecified asthma, uncomplicated: J45.909

## 2022-10-29 NOTE — Discharge Instructions (Signed)
Avoid overdosing on drugs.  See your doctor for follow-up  Return to ER if you are lethargic, vomiting

## 2022-10-29 NOTE — ED Provider Notes (Signed)
[place] [facility] Provider Note   CSN: [id number] Arrival date & time: 10/29/22  1851     History  Chief Complaint  Patient presents with   Drug Overdose    Calvin Woodward is a 32 y.o. male here presenting with drug overdose.  Patient was arrested for stealing a car.  He went to jail and tried to overdose on heroin.  He states that he just swallowed some powder with heroin.  Patient was given Narcan prior to arrival.  He is now awake and alert.  Denies trying to kill himself.  Patient is in police custody currently  The history is provided by the patient.       Home Medications Prior to Admission medications   Not on File      Allergies    Patient has no known allergies.    Review of Systems   Review of Systems  Psychiatric/Behavioral:  Positive for confusion.   All other systems reviewed and are negative.   Physical Exam Updated Vital Signs BP 109/75 (BP Location: Right Arm)   Pulse 75   Temp 98.7 F (37.1 C) (Oral)   Ht 5\' 7"  (1.702 m)   Wt 69 kg   SpO2 100%   BMI 23.82 kg/m  Physical Exam Vitals and nursing note reviewed.  Constitutional:      Comments: Slightly sleepy but awake and alert  HENT:     Head: Normocephalic.     Nose: Nose normal.     Mouth/Throat:     Mouth: Mucous membranes are moist.  Eyes:     Comments: Post 3 mm reactive bilaterally  Cardiovascular:     Rate and Rhythm: Normal rate and regular rhythm.     Pulses: Normal pulses.     Heart sounds: Normal heart sounds.  Pulmonary:     Effort: Pulmonary effort is normal.     Breath sounds: Normal breath sounds.  Abdominal:     General: Abdomen is flat.     Palpations: Abdomen is soft.  Musculoskeletal:        General: Normal range of motion.     Cervical back: Normal range of motion and neck supple.  Skin:    General: Skin is warm.     Capillary Refill: Capillary refill takes less than 2 seconds.  Neurological:     General: No focal  deficit present.     Mental Status: He is alert and oriented to person, place, and time.  Psychiatric:        Mood and Affect: Mood normal.        Behavior: Behavior normal.     ED Results / Procedures / Treatments   Labs (all labs ordered are listed, but only abnormal results are displayed) Labs Reviewed  CBC WITH DIFFERENTIAL/PLATELET - Abnormal; Notable for the following components:      Result Value   WBC 12.0 (*)    Hemoglobin 12.5 (*)    RDW 16.2 (*)    Platelets 401 (*)    Neutro Abs 9.4 (*)    Abs Immature Granulocytes 0.21 (*)    All other components within normal limits  COMPREHENSIVE METABOLIC PANEL  SALICYLATE LEVEL  ACETAMINOPHEN LEVEL  ETHANOL  RAPID URINE DRUG SCREEN, HOSP PERFORMED  CBG MONITORING, ED    EKG None  Radiology No results found.  Procedures Procedures    Medications Ordered in ED Medications  sodium chloride 0.9 % bolus 1,000 mL (1,000 mLs Intravenous  New Bag/Given 10/29/22 1914)    ED Course/ Medical Decision Making/ A&P                             Medical Decision Making Calvin Woodward is a 32 y.o. male here presenting with possible overdose.  Patient swallowed some heroin while he was in jail.  Plan to get CBC and CMP and serum and urine tox and observe for several hours.  Will hold off on Narcan currently  9:29 PM Patient observed for 3 hours.  Patient has marijuana in his system and negative for opiates and benzos.  Patient is awake and alert.  At this point he is medically cleared to go back to jail.  Amount and/or Complexity of Data Reviewed Labs: ordered. Decision-making details documented in ED Course.    Final Clinical Impression(s) / ED Diagnoses Final diagnoses:  None    Rx / DC Orders ED Discharge Orders     None         Drenda Freeze, MD 10/29/22 2129

## 2022-10-29 NOTE — ED Triage Notes (Signed)
Pt. Was at the jail when he overdosed on heroin. Pt states he took a gram. Pt was given 4 of narcan, pt. Was lethargic when ems arrived. He is alert x4 .  [id number]

## 2023-03-06 DIAGNOSIS — K920 Hematemesis: Secondary | ICD-10-CM | POA: Insufficient documentation

## 2023-03-06 DIAGNOSIS — A64 Unspecified sexually transmitted disease: Secondary | ICD-10-CM | POA: Insufficient documentation

## 2023-03-06 DIAGNOSIS — J45909 Unspecified asthma, uncomplicated: Secondary | ICD-10-CM | POA: Insufficient documentation

## 2023-03-06 DIAGNOSIS — N451 Epididymitis: Secondary | ICD-10-CM | POA: Insufficient documentation

## 2023-03-06 DIAGNOSIS — K921 Melena: Secondary | ICD-10-CM

## 2023-03-06 NOTE — ED Notes (Signed)
Officers and pt received dc papers and verbalized understanding of dc at this time. Pt IV removed. Pt ambulated out of ED w steady gait, remains in custody and handcuffs at time of DC. Pt a&ox4,vss,nad.

## 2023-03-06 NOTE — Discharge Instructions (Signed)
Thank you for letting us take care of you today.  We treated you for a condition called epididymitis which I have provided handouts explaining. Your blood counts and other labs today were normal. We gave you multiple antibiotics to treat the most common sexually transmitted diseases. The only pending test is for syphilis. If this is positive, you will be contacted by the hospital for further treatment.  With the vomiting and blood in your vomit/stool, I have referred you to a gastroenterologist or stomach specialist for further evaluation in the office. They may want to do additional testing or start you on medication. In the meantime, I am prescribing a medication to help with nausea if needed.  For new or worsening symptoms, return to nearest ED for re-evaluation.

## 2023-03-06 NOTE — ED Notes (Signed)
Pt in custody from jail, two officers at bedside with handcuffs in place.

## 2023-03-06 NOTE — ED Triage Notes (Signed)
Pt from jail. Pt states he has been throwing up blood, peeing blood, and blood in stool for 2 months. axox4

## 2023-03-06 NOTE — ED Notes (Signed)
Pt transported to [place]. 2 officers remain at bedside with pt during transportation.

## 2023-03-06 NOTE — ED Provider Notes (Addendum)
[name] Provider Note   CSN: [id number] Arrival date & time: 03/06/23  1438     History  Chief Complaint  Patient presents with   Hematuria   Hematemesis    Calvin Woodward is a 32 y.o. male with past medical history asthma presents to the ED complaining of hematemesis, hematochezia, and hematuria for at least the last 2 months.  He states that he will have at least 1 episode of hematemesis daily after "being too active."  Notes that every time he has a bowel movement there is bright red blood in it.  Last bowel movement was today.  States that every time he urinates as well and has blood in it.  Notes that prior to 2 months ago this had never happened before.  States that he came to the hospital to be evaluated for this closer to the onset but had an active working out and thus left without being seen.  States he has associated pain in his genitalia "when I squeeze if a lot of green stuff comes out."  Notes that a partner may have exposed him to STDs.  No associated abdominal pain, chest pain, shortness of breath.  No anticoagulant use.  No personal or family history of bleeding disorders.  No recent travel. No easy bruising. No injuries.  No history of heavy alcohol or NSAID use.  No history of illicit drug use. Actively in police custody with 2 officers at bedside.       Home Medications No daily medications  Allergies    Patient has no known allergies.    Review of Systems   Review of Systems  All other systems reviewed and are negative.   Physical Exam Updated Vital Signs BP (!) 117/93   Pulse 79   Temp 98.2 F (36.8 C) (Oral)   Resp 18   Ht 5\' 8"  (1.727 m)   Wt 72.6 kg   SpO2 100%   BMI 24.33 kg/m  Physical Exam Vitals and nursing note reviewed.  Constitutional:      General: He is not in acute distress.    Appearance: Normal appearance. He is not ill-appearing, toxic-appearing or diaphoretic.  HENT:      Head: Normocephalic and atraumatic.     Mouth/Throat:     Mouth: Mucous membranes are moist.  Eyes:     General: No scleral icterus.    Extraocular Movements: Extraocular movements intact.     Conjunctiva/sclera: Conjunctivae normal.     Pupils: Pupils are equal, round, and reactive to light.  Cardiovascular:     Rate and Rhythm: Normal rate and regular rhythm.     Heart sounds: No murmur heard. Pulmonary:     Effort: Pulmonary effort is normal.     Breath sounds: Normal breath sounds.  Abdominal:     General: Abdomen is flat. There is no distension.     Palpations: Abdomen is soft.     Tenderness: There is no abdominal tenderness. There is no right CVA tenderness, left CVA tenderness, guarding or rebound.  Musculoskeletal:        General: No deformity. Normal range of motion.     Cervical back: Normal range of motion and neck supple.     Right lower leg: No edema.     Left lower leg: No edema.  Skin:    General: Skin is warm and dry.     Capillary Refill: Capillary refill takes less than 2 seconds.  Coloration: Skin is not jaundiced or pale.     Findings: No bruising, petechiae or rash.  Neurological:     General: No focal deficit present.     Mental Status: He is alert and oriented to person, place, and time.  Psychiatric:        Mood and Affect: Affect is labile and blunt.        Speech: Speech normal.        Behavior: Behavior is cooperative.     ED Results / Procedures / Treatments   Labs (all labs ordered are listed, but only abnormal results are displayed) Labs Reviewed  COMPREHENSIVE METABOLIC PANEL - Abnormal; Notable for the following components:      Result Value   Glucose, Bld 105 (*)    All other components within normal limits  URINALYSIS, ROUTINE W REFLEX MICROSCOPIC - Abnormal; Notable for the following components:   APPearance HAZY (*)    Leukocytes,Ua TRACE (*)    All other components within normal limits  RAPID URINE DRUG SCREEN, HOSP PERFORMED  - Abnormal; Notable for the following components:   Tetrahydrocannabinol POSITIVE (*)    All other components within normal limits  URINALYSIS, MICROSCOPIC (REFLEX) - Abnormal; Notable for the following components:   Bacteria, UA FEW (*)    All other components within normal limits  CBC WITH DIFFERENTIAL/PLATELET  PROTIME-INR  LIPASE, BLOOD  ETHANOL  HIV ANTIBODY (ROUTINE TESTING W REFLEX)  RPR  POC OCCULT BLOOD, ED  ABO/RH  GC/CHLAMYDIA PROBE AMP ([place]) NOT AT [facility]    EKG None  Radiology US SCROTUM W/DOPPLER  Result Date: 03/06/2023 CLINICAL DATA:  Testicular pain.  GU pain/discharge/bleeding. EXAM: SCROTAL ULTRASOUND DOPPLER ULTRASOUND OF THE TESTICLES TECHNIQUE: Complete ultrasound examination of the testicles, epididymis, and other scrotal structures was performed. Color and spectral Doppler ultrasound were also utilized to evaluate blood flow to the testicles. COMPARISON:  None Available. FINDINGS: Right testicle Measurements: 3.8 x 2.4 x 3.7 cm. No mass or microlithiasis visualized. Left testicle Measurements: 3.6 x 2.1 x 3.4 cm. No mass or microlithiasis visualized. Right epididymis: Asymmetrically enlarged and heterogeneous compared to the left, likely with mildly increased vascularity. Left epididymis:  Normal in size and appearance. Hydrocele:  Small, mildly complex left hydrocele. Varicocele:  None visualized. Pulsed Doppler interrogation of both testes demonstrates normal low resistance arterial and venous waveforms bilaterally. IMPRESSION: 1. Findings suspicious for right epididymitis. 2. Small left hydrocele. 3. Unremarkable appearance of the testicles. No evidence of testicular torsion. Electronically Signed   By: Sebastian Ache M.D.   On: 03/06/2023 16:24    Procedures Procedures    Medications Ordered in ED Medications  pantoprazole (PROTONIX) injection 40 mg (40 mg Intravenous Given 03/06/23 1802)  ondansetron (ZOFRAN) injection 4 mg (4 mg Intravenous Given  03/06/23 1802)  azithromycin (ZITHROMAX) tablet 1,000 mg (1,000 mg Oral Given 03/06/23 1801)  cefTRIAXone (ROCEPHIN) 1,000 mg in sodium chloride 0.9 % 100 mL IVPB (0 mg Intravenous Stopped 03/06/23 1838)  metroNIDAZOLE (FLAGYL) tablet 2,000 mg (2,000 mg Oral Given 03/06/23 1801)  lactated ringers bolus 1,000 mL (0 mLs Intravenous Stopped 03/06/23 1805)    ED Course/ Medical Decision Making/ A&P                             Medical Decision Making Amount and/or Complexity of Data Reviewed Labs: ordered. Decision-making details documented in ED Course. Radiology: ordered. Decision-making details documented in ED Course. ECG/medicine tests: ordered.  Decision-making details documented in ED Course.  Risk Prescription drug management.   Medical Decision Making:   Adhiraj Yuill is a 32 y.o. male who presented to the ED today with bleeding detailed above.    Patient's presentation is complicated by their history of incarceration.  Complete initial physical exam performed, notably the patient was in NAD. HD stable. Normal neuro exam. Abdomen soft and nontender. RRR, LCTA. No ecchymosis noted to skin, no rash.    Reviewed and confirmed nursing documentation for past medical history, family history, social history.    Initial Assessment:   With the patient's presentation, differential diagnosis includes but is not limited to upper GI bleed, lower GI bleed, bleeding disorder, anemia, STD, testicular torsion, malingering.  This is most consistent with an acute complicated illness  Initial Plan:  Screening labs including CBC and Metabolic panel to evaluate for infectious or metabolic etiology of disease.  Urinalysis with reflex culture and UDS ordered to evaluate for UTI or relevant urologic/nephrologic pathology.  Scrotal US to evaluate for GU pain/discharge/bleeding EKG to evaluate for cardiac pathology Lipase to evaluate for pancreatitis Type and screen in setting of reported active  bleeding Ethanol to assess for substance use STD testing Patient declined rectal and GU exam/hemoccult Objective evaluation as below reviewed   Initial Study Results:   Laboratory  All laboratory results reviewed without evidence of clinically relevant pathology.   Exceptions include: UA with leuks, UDS positive for [facility]   Radiology:  All images reviewed independently. Agree with radiology report at this time.   US SCROTUM W/DOPPLER  Result Date: 03/06/2023 CLINICAL DATA:  Testicular pain.  GU pain/discharge/bleeding. EXAM: SCROTAL ULTRASOUND DOPPLER ULTRASOUND OF THE TESTICLES TECHNIQUE: Complete ultrasound examination of the testicles, epididymis, and other scrotal structures was performed. Color and spectral Doppler ultrasound were also utilized to evaluate blood flow to the testicles. COMPARISON:  None Available. FINDINGS: Right testicle Measurements: 3.8 x 2.4 x 3.7 cm. No mass or microlithiasis visualized. Left testicle Measurements: 3.6 x 2.1 x 3.4 cm. No mass or microlithiasis visualized. Right epididymis: Asymmetrically enlarged and heterogeneous compared to the left, likely with mildly increased vascularity. Left epididymis:  Normal in size and appearance. Hydrocele:  Small, mildly complex left hydrocele. Varicocele:  None visualized. Pulsed Doppler interrogation of both testes demonstrates normal low resistance arterial and venous waveforms bilaterally. IMPRESSION: 1. Findings suspicious for right epididymitis. 2. Small left hydrocele. 3. Unremarkable appearance of the testicles. No evidence of testicular torsion. Electronically Signed   By: Sebastian Ache M.D.   On: 03/06/2023 16:24      Final Assessment and Plan:   32 year old male presents to the ED with complaints of hematuria, hematemesis, and hematochezia.  Appears to be ongoing for at least 2 months.  No history of bleeding disorders.  No anticoagulant use.  No signs of ecchymosis/bleeding on exam as above. Patient hemodynamically  stable.  Nontoxic-appearing.  Abdomen soft, nontender, and without peritoneal signs.  Patient overall well-appearing.  Given dose of Protonix and Zofran.  No active vomiting in the ED.  Tolerating p.o. without difficulty.  Does also express concern for STDs.  Scrotal ultrasound shows suspected right-sided epididymitis.  Patient would not allow for GU/rectal exam.  Hemoglobin is normal.  UA without hemoglobin.  It does have some white blood cells, suspect that this is secondary to STD.  HIV is nonreactive.  Pending gonorrhea and chlamydia.  Will empirically treat.  Given patient is incarcerated, provided with one-time doses of medication treatment for gonorrhea, chlamydia,  and trichomoniasis.  Will also prescribe doxycycline for outpatient coverage.  Discussed all findings with patient which are overall reassuring and did not show signs of any emergent process.  Discussed with patient that I recommend following up with gastroenterology.  This information was provided on paperwork to guards at bedside as well.  Patient expressed understanding of plan.  Strict ED return precautions given, all questions answered, and stable for discharge.    Clinical Impression:  1. Right epididymitis   2. Sexually transmitted disease   3. Hematemesis with nausea   4. Hematochezia      Discharge        Final Clinical Impression(s) / ED Diagnoses Final diagnoses:  Right epididymitis  Sexually transmitted disease  Hematemesis with nausea  Hematochezia    Rx / DC Orders ED Discharge Orders          Ordered    doxycycline (VIBRAMYCIN) 100 MG capsule  2 times daily        03/06/23 1629    Ambulatory referral to Gastroenterology        03/06/23 1843    ondansetron (ZOFRAN) 4 MG tablet  Every 8 hours PRN        03/06/23 1843              Tonette Lederer, PA-C 03/06/23 1912    Tonette Lederer, PA-C 03/06/23 1913    Jacalyn Lefevre, MD 03/07/23 [phone]

## 2024-02-15 ENCOUNTER — Other Ambulatory Visit: Payer: Self-pay

## 2024-02-15 ENCOUNTER — Emergency Department (HOSPITAL_COMMUNITY): Admission: EM | Admit: 2024-02-15 | Discharge: 2024-02-15 | Disposition: A | Discharge status: Home / Self Care

## 2024-02-15 ENCOUNTER — Encounter (HOSPITAL_COMMUNITY): Payer: Self-pay

## 2024-02-15 DIAGNOSIS — R569 Unspecified convulsions: Secondary | ICD-10-CM

## 2024-02-15 HISTORY — DX: Unspecified convulsions: R56.9

## 2024-02-15 LAB — BASIC METABOLIC PANEL WITH GFR
Anion gap: 7 (ref 5–15)
BUN: 10 mg/dL (ref 6–20)
CO2: 26 mmol/L (ref 22–32)
Calcium: 8.9 mg/dL (ref 8.9–10.3)
Chloride: 106 mmol/L (ref 98–111)
Creatinine, Ser: 0.94 mg/dL (ref 0.61–1.24)
GFR, Estimated: >60 mL/min (ref >60)
Glucose, Bld: 76 mg/dL (ref 70–99)
Potassium: 4.1 mmol/L (ref 3.5–5.1)
Sodium: 139 mmol/L (ref 135–145)

## 2024-02-15 LAB — CBC
HCT: 41.8 % (ref 39.0–52.0)
Hemoglobin: 13 g/dL (ref 13.0–17.0)
MCH: 27.5 pg (ref 26.0–34.0)
MCHC: 31.1 g/dL (ref 30.0–36.0)
MCV: 88.4 fL (ref 80.0–100.0)
Platelets: 204 10*3/uL (ref 150–400)
RBC: 4.73 MIL/uL (ref 4.22–5.81)
RDW: 14.1 % (ref 11.5–15.5)
WBC: 4.1 10*3/uL (ref 4.0–10.5)
nRBC: 0 % (ref 0.0–0.2)

## 2024-02-15 LAB — CBG MONITORING, ED: Glucose-Capillary: 89 mg/dL (ref 70–99)

## 2024-02-15 NOTE — ED Triage Notes (Addendum)
 Patient BIB [facility] from jail. History of seizures. 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 Had a focal seizure, stared off into space for a couple minutes and full body convulsions for 2 minutes in a wheelchair. No striking of head. No oral trauma. Denies taking seizure medication.

## 2024-02-15 NOTE — ED Provider Notes (Addendum)
 [place] EMERGENCY DEPARTMENT AT [facility] Provider Note   CSN: [id number] Arrival date & time: 02/15/24  1731     History  Chief Complaint  Patient presents with   Seizures    Calvin Woodward is a 33 y.o. male.  Is a 33 year old male presenting emergency department with seizure-like activity from jail.  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  Per paperwork on Librium.  He reports a history of seizures ever since he was little that happens when he is in stressful situations.  He does not take antiepileptic.  He denies being followed by neurologist.  Reportedly stared off into space for a couple minutes followed by full body jerking motions per EMS reports.  Patient does not recall events.  Did not bite his head, did not bite his tongue, no bowel or bladder incontinence.  He reports that he has not eaten for the past 5 days in a protest so he could talk to his family.   Seizures      Home Medications Prior to Admission medications   Not on File      Allergies    Patient has no known allergies.    Review of Systems   Review of Systems  Neurological:  Positive for seizures.    Physical Exam Updated Vital Signs BP 123/86   Pulse 69   Temp 98.2 F (36.8 C)   Resp 18   Ht 5\' 9"  (1.753 m)   Wt 73 kg   SpO2 99%   BMI 23.78 kg/m  Physical Exam Vitals and nursing note reviewed.  Constitutional:      General: He is not in acute distress.    Appearance: He is not toxic-appearing.  HENT:     Head: Normocephalic and atraumatic.     Nose: Nose normal.     Mouth/Throat:     Mouth: Mucous membranes are dry.  Eyes:     Extraocular Movements: Extraocular movements intact.     Conjunctiva/sclera: Conjunctivae normal.     Pupils: Pupils are equal, round, and reactive to light.  Cardiovascular:     Rate and Rhythm: Normal rate and regular rhythm.  Pulmonary:     Effort: Pulmonary effort is normal.     Breath sounds: Normal breath sounds.   Abdominal:     General: Abdomen is flat. There is no distension.     Palpations: Abdomen is soft.     Tenderness: There is no abdominal tenderness. There is no guarding or rebound.  Musculoskeletal:        General: Normal range of motion.  Skin:    General: Skin is warm and dry.     Capillary Refill: Capillary refill takes less than 2 seconds.  Neurological:     General: No focal deficit present.     Mental Status: He is alert and oriented to person, place, and time.     Cranial Nerves: No cranial nerve deficit.     Sensory: No sensory deficit.     Motor: No weakness.     Coordination: Coordination normal.  Psychiatric:        Mood and Affect: Mood normal.        Behavior: Behavior normal.     ED Results / Procedures / Treatments   Labs (all labs ordered are listed, but only abnormal results are displayed) Labs Reviewed  CBC  BASIC METABOLIC PANEL WITH GFR  CBG MONITORING, ED    EKG EKG Interpretation Date/Time:  Sunday  Feb 15 2024 18:23:12 EDT Ventricular Rate:  79 PR Interval:  143 QRS Duration:  92 QT Interval:  373 QTC Calculation: 428 R Axis:   59  Text Interpretation: Sinus rhythm Biatrial enlargement ST elev, probable normal early repol pattern Confirmed by Elise Guile [phone]) on 02/15/2024 6:59:02 PM  Radiology CT Head Wo Contrast Result Date: 02/15/2024 CLINICAL DATA:  New onset seizure. Detoxing from alcohol for 5 days. EXAM: CT HEAD WITHOUT CONTRAST TECHNIQUE: Contiguous axial images were obtained from the base of the skull through the vertex without intravenous contrast. RADIATION DOSE REDUCTION: This exam was performed according to the departmental dose-optimization program which includes automated exposure control, adjustment of the mA and/or kV according to patient size and/or use of iterative reconstruction technique. COMPARISON:  CT head 12/31/2019 FINDINGS: Brain: No intracranial hemorrhage, mass effect, or evidence of acute infarct. No hydrocephalus.  No extra-axial fluid collection. Vascular: No hyperdense vessel or unexpected calcification. Skull: No fracture or focal lesion. Sinuses/Orbits: No acute finding. Other: None. IMPRESSION: No acute intracranial abnormality. Electronically Signed   By: Rozell Cornet M.D.   On: 02/15/2024 19:49    Procedures Procedures    Medications Ordered in ED Medications - No data to display  ED Course/ Medical Decision Making/ A&P                                 Medical Decision Making 33 year old male presented to the emergency department with reported seizure-like activity.  He is afebrile nontachycardic, normotensive maintaining ox saturation on room air.  No reported postictal period, did not bite his tongue, no bowel or bladder incontinence.  He has a nonfocal neurologic exam today.  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  He is not on analeptics.  He has been observed in the emergency department for 3 hours with no further seizure activity.  Given his report of not eating or drinking for the past 5 days basic labs obtained.  He has no metabolic derangements or dehydration.  Clinically euvolemic.  He has no leukocytosis to suggest infectious process.  CT head obtained given his unclear past medical history with self-reported seizures.  CT head negative for acute pathology.  I suspect his visit today may stem from secondary intention versus PNES stable for discharge at this time.  Amount and/or Complexity of Data Reviewed Independent Historian:     Details: Paperwork sent with him states that he has been on Librium.  Is not currently having withdrawal symptoms.  Low suspicion that had alcohol withdrawal seizure. External Data Reviewed: radiology.    Details: No prior notes from neurology.  CT head in 2021 normal as well. Labs: ordered. Decision-making details documented in ED Course. Radiology: ordered. ECG/medicine tests: ordered and independent interpretation  performed.    Details: Appears to be normal sinus rhythm with benign early repull pattern.  No Brugada or hokum pattern  Risk Decision regarding hospitalization. Diagnosis or treatment significantly limited by social determinants of health. Risk Details: In jail.  Poor health literacy         Final Clinical Impression(s) / ED Diagnoses Final diagnoses:  Seizure-like activity [facility])    Rx / DC Orders ED Discharge Orders     None         Rolinda Climes, DO 02/15/24 2021    Rolinda Climes, DO 02/15/24 2022

## 2024-02-15 NOTE — Discharge Instructions (Signed)
 Please return for fevers, headache, hallucinations, chest pain, shortness of breath, abdominal pain, inability to eat or drink due to nausea vomiting, you pass out, have further seizure activity or any new or worsening symptoms that are concerning to you.
